# Patient Record
Sex: Male | Born: 2000 | Race: White | Hispanic: No | Marital: Single | Smoking: Never smoker
Health system: Southern US, Community
[De-identification: ages and names within clinical notes are randomized; demographics above are authoritative.]

## PROBLEM LIST (undated history)

## (undated) DIAGNOSIS — R1115 Cyclical vomiting syndrome unrelated to migraine: Secondary | ICD-10-CM

## (undated) DIAGNOSIS — G43909 Migraine, unspecified, not intractable, without status migrainosus: Secondary | ICD-10-CM

---
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
## 2021-02-28 ENCOUNTER — Ambulatory Visit: Payer: Self-pay | Admitting: Family Medicine

## 2021-06-11 ENCOUNTER — Ambulatory Visit: Payer: Self-pay | Admitting: Family Medicine

## 2021-06-14 ENCOUNTER — Ambulatory Visit: Payer: Self-pay | Admitting: Family Medicine

## 2021-06-21 ENCOUNTER — Ambulatory Visit: Payer: Self-pay | Admitting: Family Medicine

## 2024-01-13 DIAGNOSIS — Z1389 Encounter for screening for other disorder: Secondary | ICD-10-CM | POA: Diagnosis not present

## 2024-01-13 DIAGNOSIS — Z23 Encounter for immunization: Secondary | ICD-10-CM | POA: Diagnosis not present

## 2024-01-13 DIAGNOSIS — R079 Chest pain, unspecified: Secondary | ICD-10-CM | POA: Diagnosis not present

## 2024-08-24 DIAGNOSIS — R0602 Shortness of breath: Secondary | ICD-10-CM | POA: Diagnosis not present
